# Patient Record
Sex: Female | Born: 1990 | Race: White | Hispanic: No | Marital: Single
Health system: Southern US, Community
[De-identification: ages and names within clinical notes are randomized; demographics above are authoritative.]

---
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
## 2011-01-06 LAB — POCT PREGNANCY, URINE: Preg Test, Ur: NEGATIVE

## 2011-01-06 LAB — POCT I-STAT, CHEM 8
BUN: 8 mg/dL (ref 6–23)
Creatinine, Ser: 0.7 mg/dL (ref 0.4–1.2)
Hemoglobin: 11.9 g/dL — ABNORMAL LOW (ref 12.0–16.0)
TCO2: 23 mmol/L (ref 0–100)

## 2011-01-06 LAB — DIFFERENTIAL
Eosinophils Absolute: 0.1 10*3/uL (ref 0.0–1.2)
Eosinophils Relative: 2 % (ref 0–5)
Lymphocytes Relative: 40 % (ref 24–48)
Lymphs Abs: 1.9 10*3/uL (ref 1.1–4.8)
Monocytes Absolute: 0.4 10*3/uL (ref 0.2–1.2)
Monocytes Relative: 9 % (ref 3–11)
Neutro Abs: 2.3 10*3/uL (ref 1.7–8.0)

## 2011-01-06 LAB — COMPREHENSIVE METABOLIC PANEL
ALT: 18 U/L (ref 0–35)
AST: 29 U/L (ref 0–37)
Albumin: 3.4 g/dL — ABNORMAL LOW (ref 3.5–5.2)
BUN: 8 mg/dL (ref 6–23)
Glucose, Bld: 106 mg/dL — ABNORMAL HIGH (ref 70–99)
Sodium: 138 mEq/L (ref 135–145)
Total Bilirubin: 0.2 mg/dL — ABNORMAL LOW (ref 0.3–1.2)
Total Protein: 6.2 g/dL (ref 6.0–8.3)

## 2011-01-06 LAB — URINALYSIS, ROUTINE W REFLEX MICROSCOPIC
Ketones, ur: NEGATIVE mg/dL
Protein, ur: NEGATIVE mg/dL

## 2011-01-06 LAB — CBC
RDW: 19 % — ABNORMAL HIGH (ref 11.4–15.5)
WBC: 4.7 10*3/uL (ref 4.5–13.5)

## 2011-07-02 LAB — DIFFERENTIAL
Basophils Absolute: 0
Basophils Relative: 0
Eosinophils Absolute: 0.1
Lymphs Abs: 1.5
Monocytes Absolute: 0.6
Neutro Abs: 7.9

## 2011-07-02 LAB — CBC
MCHC: 33.9
MCV: 85.7
RBC: 4.54
RDW: 15.9 — ABNORMAL HIGH
WBC: 10.2

## 2014-09-25 ENCOUNTER — Telehealth: Payer: Self-pay | Admitting: Neurology

## 2014-09-25 MED ORDER — SUMATRIPTAN SUCCINATE 100 MG PO TABS: 100.0000 mg | ORAL_TABLET | Freq: Two times a day (BID) | ORAL | Status: AC | PRN

## 2014-09-25 NOTE — Telephone Encounter (Signed)
[place], requesting Rx for Imitrex having cluster Migraines.   Last seen 11/12/11 (see [facility]).  Please call and advise.  [phone].

## 2014-09-25 NOTE — Telephone Encounter (Signed)
I called the patient, spoke with the mother. The patient has not been seen through this office in about two-years. She has not had any headaches for a urine, now is having multiple headaches. She now lives in [place]. She will need to get a doctor that area, I will call in a small prescription for Imitrex at this time.
